# Patient Record
Sex: Male | Born: 1985 | Race: Black or African American | Hispanic: No | Marital: Single | Smoking: Current every day smoker
Health system: Southern US, Community
[De-identification: ages and names within clinical notes are randomized; demographics above are authoritative.]

## PROBLEM LIST (undated history)

## (undated) HISTORY — PX: NO PAST SURGERIES: SHX2092

---

## 2017-10-18 ENCOUNTER — Ambulatory Visit
Admission: EM | Admit: 2017-10-18 | Discharge: 2017-10-18 | Disposition: A | Discharge status: Home / Self Care | Payer: BLUE CROSS/BLUE SHIELD | Attending: Family Medicine | Admitting: Family Medicine

## 2017-10-18 ENCOUNTER — Encounter: Payer: Self-pay | Admitting: *Deleted

## 2017-10-18 DIAGNOSIS — R110 Nausea: Secondary | ICD-10-CM

## 2017-10-18 DIAGNOSIS — Z113 Encounter for screening for infections with a predominantly sexual mode of transmission: Secondary | ICD-10-CM | POA: Diagnosis not present

## 2017-10-18 DIAGNOSIS — R197 Diarrhea, unspecified: Secondary | ICD-10-CM | POA: Diagnosis not present

## 2017-10-18 DIAGNOSIS — Z202 Contact with and (suspected) exposure to infections with a predominantly sexual mode of transmission: Secondary | ICD-10-CM | POA: Diagnosis not present

## 2017-10-18 DIAGNOSIS — F17200 Nicotine dependence, unspecified, uncomplicated: Secondary | ICD-10-CM

## 2017-10-18 DIAGNOSIS — R1310 Dysphagia, unspecified: Secondary | ICD-10-CM | POA: Diagnosis not present

## 2017-10-18 LAB — CHLAMYDIA/NGC RT PCR (ARMC ONLY)
Chlamydia Tr: NOT DETECTED
N gonorrhoeae: NOT DETECTED

## 2017-10-18 NOTE — Discharge Instructions (Signed)
Recommend use of Metamucil type medication on a daily basis. Laboratory tests will be available tomorrow and over the next 3-4 days. Follow-up with ear nose and throat physician regarding your swallowing difficulty.

## 2017-10-18 NOTE — ED Provider Notes (Signed)
[facility]    CSN: [id number] Arrival date & time: 10/18/17  1038     History   Chief Complaint Chief Complaint  Patient presents with  . Sore Throat  . Nausea  . Diarrhea    HPI Juan Humphrey is a 31 y.o. male.   HPI  This a 31 year old male who presents with a several different problems.  His problem is that of nausea and diarrhea that he's had for 1 month. He states that he's had intermittent green stools. Is also concerned that he saw stool does not appear "normal". The stool is mostly loose but not quite diarrhea. He does admit that only eats  meat and does not eat any fruits or vegetables. He denies any abdominal pain except for loading prior to a BM and then feels improvement afterwards. The pain is diffuse. He has had no vomiting. He denies any weight loss. Denies any melena; has had no tarry stools.  Second problem is that of possible STD exposure. He did use protection but is still concerned that he had an encounter with a male that he did not know. This was approximately 2 weeks ago. He denies any scrotal pain. He's had no penile discharge. He has had no sores on his penis or in the groin. He denies any fever or chills.  He is also concerned with a feeling in his throat on the right when he swallows. Is not painful. He is just alarmed regarding presence due to his smoking history. He states his mother and aunt also had polyps.        History reviewed. No pertinent past medical history.  There are no active problems to display for this patient.   History reviewed. No pertinent surgical history.     Home Medications    Prior to Admission medications   Not on File    Family History Family History  Problem Relation Age of Onset  . Hypertension Mother   . Hypertension Father     Social History Social History  Substance Use Topics  . Smoking status: Current Every Day Smoker    Packs/day: 0.50    Types: Cigarettes  . Smokeless  tobacco: Never Used  . Alcohol use Yes     Allergies   Patient has no known allergies.   Review of Systems Review of Systems  Constitutional: Positive for activity change and appetite change. Negative for chills, fatigue and fever.  HENT: Positive for trouble swallowing.   Gastrointestinal: Positive for constipation and nausea. Negative for anal bleeding, blood in stool, rectal pain and vomiting.  Genitourinary: Negative for difficulty urinating, discharge, dysuria, genital sores, penile pain, scrotal swelling, testicular pain and urgency.  All other systems reviewed and are negative.    Physical Exam Triage Vital Signs ED Triage Vitals  Enc Vitals Group     BP 10/18/17 1114 (!) 141/83     Pulse Rate 10/18/17 1114 73     Resp 10/18/17 1114 16     Temp 10/18/17 1114 98.4 F (36.9 C)     Temp Source 10/18/17 1114 Oral     SpO2 10/18/17 1114 100 %     Weight 10/18/17 1115 198 lb (89.8 kg)     Height 10/18/17 1115 5\' 11"  (1.803 m)     Head Circumference --      Peak Flow --      Pain Score 10/18/17 1116 0     Pain Loc --      Pain Edu? --  Excl. in GC? --    No data found.   Updated Vital Signs BP (!) 141/83 (BP Location: Left Arm)   Pulse 73   Temp 98.4 F (36.9 C) (Oral)   Resp 16   Ht 5\' 11"  (1.803 m)   Wt 198 lb (89.8 kg)   SpO2 100%   BMI 27.62 kg/m   Visual Acuity Right Eye Distance:   Left Eye Distance:   Bilateral Distance:    Right Eye Near:   Left Eye Near:    Bilateral Near:     Physical Exam  Constitutional: He is oriented to person, place, and time. He appears well-developed and well-nourished. No distress.  HENT:  Head: Normocephalic.  Right Ear: External ear normal.  Left Ear: External ear normal.  Nose: Nose normal.  Mouth/Throat: Oropharynx is clear and moist. No oropharyngeal exudate.  Examination of the pharynx and throat shows no masses palpable. He has no lesions in the posterior pharynx. It appears normal. With swallowing  today he has no perception of the right sided abnormality that he describes. Is no obvious abnormality of the swallowing mechanism.  Eyes: Pupils are equal, round, and reactive to light. Right eye exhibits no discharge. Left eye exhibits no discharge.  Neck: Normal range of motion.  Pulmonary/Chest: Effort normal and breath sounds normal.  Abdominal: Soft. Bowel sounds are normal. He exhibits no distension and no mass. There is no tenderness. There is no rebound and no guarding.  Musculoskeletal: Normal range of motion.  Lymphadenopathy:    He has no cervical adenopathy.  Neurological: He is alert and oriented to person, place, and time.  Skin: Skin is warm and dry. He is not diaphoretic.  Psychiatric: He has a normal mood and affect. His behavior is normal. Judgment and thought content normal.  Nursing note and vitals reviewed.    UC Treatments / Results  Labs (all labs ordered are listed, but only abnormal results are displayed) Labs Reviewed  CHLAMYDIA/NGC RT PCR ([facility] ONLY)  HIV ANTIBODY (ROUTINE TESTING)  RPR    EKG  EKG Interpretation None       Radiology No results found.  Procedures Procedures (including critical care time)  Medications Ordered in UC Medications - No data to display   Initial Impression / Assessment and Plan / UC Course  I have reviewed the triage vital signs and the nursing notes.  Pertinent labs & imaging results that were available during my care of the patient were reviewed by me and considered in my medical decision making (see chart for details).     Plan: 1. Test/x-ray results and diagnosis reviewed with patient 2. rx as per orders; risks, benefits, potential side effects reviewed with patient 3. Recommend supportive treatment with use of fiber bulking products. He will obtain Metamucil or similar product and start using that twice a day. If that does not help he should follow-up with a gastroenterologist or may go through  with the primary care physician. C testing was obtained at the patient's request. Results will be available tomorrow and over the next 3-4 days. I will recommend he follow-up with an ENT regarding his throat questions. I have also recommended that he  obtain a primary care physician. 4. F/u prn if symptoms worsen or don't improve   Final Clinical Impressions(s) / UC Diagnoses   Final diagnoses:  Potential exposure to STD  Tobacco use disorder    New Prescriptions There are no discharge medications for this patient.    Controlled  Substance Prescriptions [place] Controlled Substance Registry consulted? Not Applicable   Lutricia Feil, PA-C 10/18/17 1435

## 2017-10-18 NOTE — ED Triage Notes (Signed)
Patient started having symptoms of nausea and diarrhea 1 month ago. Patient reports green stools. Symptom of sore throat started 2 weeks ago.

## 2017-10-19 LAB — HIV ANTIBODY (ROUTINE TESTING W REFLEX): HIV Screen 4th Generation wRfx: NONREACTIVE

## 2017-10-19 LAB — RPR, QUANT+TP ABS (REFLEX)
Rapid Plasma Reagin, Quant: 1:1 {titer} — ABNORMAL HIGH
T Pallidum Abs: NEGATIVE

## 2017-10-19 LAB — RPR: RPR Ser Ql: REACTIVE — AB

## 2017-10-22 ENCOUNTER — Encounter: Payer: Self-pay | Admitting: *Deleted

## 2017-10-22 ENCOUNTER — Ambulatory Visit
Admission: EM | Admit: 2017-10-22 | Discharge: 2017-10-22 | Disposition: A | Discharge status: Home / Self Care | Payer: BLUE CROSS/BLUE SHIELD | Attending: Family Medicine | Admitting: Family Medicine

## 2017-10-22 DIAGNOSIS — A539 Syphilis, unspecified: Secondary | ICD-10-CM

## 2017-10-22 MED ORDER — PENICILLIN G BENZATHINE 1200000 UNIT/2ML IM SUSP
2.4000 10*6.[IU] | Freq: Once | INTRAMUSCULAR | Status: AC
  Administered 2017-10-22: 2.4 10*6.[IU] via INTRAMUSCULAR

## 2017-10-22 NOTE — ED Triage Notes (Signed)
Patient has returned for treatment of syphilis. 2.4 of bicillin given as per Dr Dayton ScrapeMurray.

## 2017-10-26 ENCOUNTER — Ambulatory Visit: Payer: Self-pay | Admitting: Nurse Practitioner

## 2019-02-24 ENCOUNTER — Ambulatory Visit (INDEPENDENT_AMBULATORY_CARE_PROVIDER_SITE_OTHER): Payer: Managed Care, Other (non HMO)

## 2019-02-24 ENCOUNTER — Ambulatory Visit
Admission: EM | Admit: 2019-02-24 | Discharge: 2019-02-24 | Disposition: A | Discharge status: Home / Self Care | Payer: Managed Care, Other (non HMO) | Attending: Family Medicine | Admitting: Family Medicine

## 2019-02-24 ENCOUNTER — Encounter: Payer: Self-pay | Admitting: Emergency Medicine

## 2019-02-24 ENCOUNTER — Other Ambulatory Visit: Payer: Self-pay

## 2019-02-24 DIAGNOSIS — Y9367 Activity, basketball: Secondary | ICD-10-CM

## 2019-02-24 DIAGNOSIS — S86001A Unspecified injury of right Achilles tendon, initial encounter: Secondary | ICD-10-CM | POA: Diagnosis not present

## 2019-02-24 MED ORDER — TRAMADOL HCL 50 MG PO TABS: 50.0000 mg | ORAL_TABLET | Freq: Three times a day (TID) | ORAL | 0 refills | Status: DC | PRN

## 2019-02-24 MED ORDER — BETAMETHASONE DIPROPIONATE 0.05 % EX OINT: TOPICAL_OINTMENT | Freq: Two times a day (BID) | CUTANEOUS | 0 refills | Status: DC

## 2019-02-24 NOTE — ED Triage Notes (Addendum)
Patient in today c/o right upper ankle/low calf pain after injuring it yesterday playing basketball.

## 2019-02-24 NOTE — ED Provider Notes (Signed)
[facility]    CSN: [id number] Arrival date & time: 02/24/19  1414  History   Chief Complaint Chief Complaint  Patient presents with  . Ankle Injury    right DOI 02/23/19    HPI  33 year old male presents with an injury.  Patient reports he was playing basketball yesterday.  He began to sprint and then suddenly felt a pop in the back of his ankle/calf.  Patient states that he has been unable to ambulate.  Pain is quite severe.  Localizes the pain to the Achilles.  Associated swelling.  Patient unable to dorsiflex and plantarflex his foot.  Worse with activity.  No relieving factors.  No other complaints.  PMH, Surgical Hx, Family Hx, Social History reviewed and updated as below.  No significant PMH.  Past Surgical History:  Procedure Laterality Date  . NO PAST SURGERIES     Home Medications    Prior to Admission medications   Medication Sig Start Date End Date Taking? Authorizing Provider  traMADol (ULTRAM) 50 MG tablet Take 1 tablet (50 mg total) by mouth every 8 (eight) hours as needed. 02/24/19   Tommie Sams, DO    Family History Family History  Problem Relation Age of Onset  . Hypertension Mother   . Hypertension Father     Social History Social History   Tobacco Use  . Smoking status: Current Every Day Smoker    Packs/day: 0.50    Years: 12.00    Pack years: 6.00    Types: Cigarettes  . Smokeless tobacco: Never Used  Substance Use Topics  . Alcohol use: Yes    Comment: rarely  . Drug use: No     Allergies   Patient has no known allergies.   Review of Systems Review of Systems  Constitutional: Negative.   Musculoskeletal:       Posterior ankle pain/achilles injury.   Physical Exam Triage Vital Signs ED Triage Vitals  Enc Vitals Group     BP 02/24/19 1504 128/90     Pulse Rate 02/24/19 1504 68     Resp 02/24/19 1504 16     Temp 02/24/19 1504 98 F (36.7 C)     Temp Source 02/24/19 1504 Oral     SpO2 02/24/19 1504 100 %   Weight 02/24/19 1505 190 lb (86.2 kg)     Height 02/24/19 1505 6' (1.829 m)     Head Circumference --      Peak Flow --      Pain Score 02/24/19 1504 8     Pain Loc --      Pain Edu? --      Excl. in GC? --     Updated Vital Signs BP 128/90 (BP Location: Left Arm)   Pulse 68   Temp 98 F (36.7 C) (Oral)   Resp 16   Ht 6' (1.829 m)   Wt 86.2 kg   SpO2 100%   BMI 25.77 kg/m   Visual Acuity Right Eye Distance:   Left Eye Distance:   Bilateral Distance:    Right Eye Near:   Left Eye Near:    Bilateral Near:     Physical Exam Constitutional:      General: He is not in acute distress.    Appearance: Normal appearance.  HENT:     Head: Normocephalic and atraumatic.  Eyes:     General: No scleral icterus.    Conjunctiva/sclera: Conjunctivae normal.  Pulmonary:     Effort: Pulmonary  effort is normal. No respiratory distress.  Musculoskeletal:     Comments: Right foot and ankle -with swelling and exquisite tenderness of the distal calf/Achilles.  Swelling noted.  Squeeze test revealed no plantar flexion.  Skin:    Comments: Right foot -raised, dry vesicular rash.  Neurological:     Mental Status: He is alert.  Psychiatric:        Mood and Affect: Mood normal.        Behavior: Behavior normal.    UC Treatments / Results  Labs (all labs ordered are listed, but only abnormal results are displayed) Labs Reviewed - No data to display  EKG None  Radiology Dg Ankle Complete Right  Result Date: 02/24/2019 CLINICAL DATA:  Distal calf pain after playing basketball yesterday. EXAM: RIGHT ANKLE - COMPLETE 3+ VIEW COMPARISON:  None. FINDINGS: No acute fracture or dislocation. Well corticated ossific density at the tip of the medial malleolus is consistent with prior trauma. The ankle mortise is symmetric. The talar dome is intact. No tibiotalar joint effusion. Joint spaces are preserved. Bone mineralization is normal. Thickening of the distal Achilles shadow with edema in  Kager's fat pad. IMPRESSION: 1. Thickening of the distal Achilles shadow with edema in Kager's fat pad, concerning for Achilles tendon injury. Consider MRI for further evaluation. 2.  No acute osseous abnormality. Electronically Signed   By: Obie Dredge M.D.   On: 02/24/2019 16:04    Procedures Procedures (including critical care time)  Medications Ordered in UC Medications - No data to display  Initial Impression / Assessment and Plan / UC Course  I have reviewed the triage vital signs and the nursing notes.  Pertinent labs & imaging results that were available during my care of the patient were reviewed by me and considered in my medical decision making (see chart for details).    33 year old male presents with Achilles tendon injury.  Placed in a posterior splint.  Crutches.  Nonweightbearing.  Tramadol for pain.  Patient is to call/see orthopedics soon as possible.  Final Clinical Impressions(s) / UC Diagnoses   Final diagnoses:  Injury of right Achilles tendon, initial encounter     Discharge Instructions     Non weight bearing.  Call (or you can go during walk in hours) [facility] in [place] ([address]).  Medication as needed.  Take care  Dr. Adriana Simas   ED Prescriptions    Medication Sig Dispense Auth. Provider   traMADol (ULTRAM) 50 MG tablet Take 1 tablet (50 mg total) by mouth every 8 (eight) hours as needed. 10 tablet Tommie Sams, DO     Controlled Substance Prescriptions [place] Controlled Substance Registry consulted? Not Applicable   Tommie Sams, DO 02/24/19 1643

## 2019-02-24 NOTE — ED Triage Notes (Signed)
Patient states he is unable to bear weight on his right foot/leg.

## 2019-02-24 NOTE — Discharge Instructions (Addendum)
Non weight bearing.  Call (or you can go during walk in hours) [facility] in [place] ([address]  Medication as needed.  Take care  Dr. Adriana Simas

## 2019-06-25 ENCOUNTER — Encounter: Payer: Self-pay | Admitting: Emergency Medicine

## 2019-06-25 ENCOUNTER — Other Ambulatory Visit: Payer: Self-pay

## 2019-06-25 ENCOUNTER — Ambulatory Visit
Admission: EM | Admit: 2019-06-25 | Discharge: 2019-06-25 | Disposition: A | Discharge status: Home / Self Care | Payer: Managed Care, Other (non HMO) | Attending: Emergency Medicine | Admitting: Emergency Medicine

## 2019-06-25 DIAGNOSIS — W57XXXA Bitten or stung by nonvenomous insect and other nonvenomous arthropods, initial encounter: Secondary | ICD-10-CM

## 2019-06-25 DIAGNOSIS — L03115 Cellulitis of right lower limb: Secondary | ICD-10-CM

## 2019-06-25 DIAGNOSIS — S80861A Insect bite (nonvenomous), right lower leg, initial encounter: Secondary | ICD-10-CM

## 2019-06-25 MED ORDER — PREDNISONE 10 MG PO TABS: ORAL_TABLET | ORAL | 0 refills | Status: DC

## 2019-06-25 MED ORDER — DOXYCYCLINE HYCLATE 100 MG PO CAPS: 100.0000 mg | ORAL_CAPSULE | Freq: Two times a day (BID) | ORAL | 0 refills | Status: AC

## 2019-06-25 NOTE — ED Triage Notes (Signed)
Patient c/o insect bite and swelling in his right lower leg.  Patient states that he has sharp pain going down his right leg into his right ankle.

## 2019-06-25 NOTE — Discharge Instructions (Signed)
Take medication as prescribed. Rest. Drink plenty of fluids. Use brace. Elevate. Ice.   Follow-up with your orthopedist this week as discussed.  Follow up with your primary care physician this week as needed. Return to Urgent care for new or worsening concerns.

## 2019-06-25 NOTE — ED Provider Notes (Signed)
[facility] ____________________________________________  Time seen: Approximately 8:48 AM  I have reviewed the triage vital signs and the nursing notes.   HISTORY  Chief Complaint Insect Bite   HPI Juan Humphrey is a 33 y.o. male presenting for evaluation of right posterior calf insect bite.  Patient reports that he woke up Monday morning with a bite mark to the back of his right calf with associated redness and itching.  States the redness and itching became fairly large and with some tenderness.  States the redness is somewhat better today.  Unsure of type of insect bite.  Does have a dog in the house.  No known tick bites.  No alleviating measures attempted.  States it is still somewhat itchy and redness present.  Denies pain to the front of his leg.  Patient reports he has a right Achilles tear that he has been dealing with for approximately 4 months and has been following with [facility].  States that he has been cleared from a cast and boot and nail using splint.  States he has noticed a little bit more swelling around his Achilles since the bite.  Denies chest pain, shortness of breath, fevers, difficulty breathing, other skin changes.  No recent sickness.  Reports tetanus immunization is up-to-date.  Denies any fall or injury.  Denies paresthesias.   History reviewed. No pertinent past medical history.  There are no active problems to display for this patient.   Past Surgical History:  Procedure Laterality Date  . NO PAST SURGERIES       No current facility-administered medications for this encounter.   Current Outpatient Medications:  .  doxycycline (VIBRAMYCIN) 100 MG capsule, Take 1 capsule (100 mg total) by mouth 2 (two) times daily for 7 days., Disp: 14 capsule, Rfl: 0 .  predniSONE (DELTASONE) 10 MG tablet, Start 60 mg po day one, then 50 mg po day two, taper by 10 mg daily until complete., Disp: 21 tablet, Rfl: 0  Allergies Patient has no known  allergies.  Family History  Problem Relation Age of Onset  . Hypertension Mother   . Hypertension Father     Social History Social History   Tobacco Use  . Smoking status: Current Every Day Smoker    Packs/day: 0.50    Years: 12.00    Pack years: 6.00    Types: Cigarettes  . Smokeless tobacco: Never Used  Substance Use Topics  . Alcohol use: Yes    Comment: rarely  . Drug use: No    Review of Systems Constitutional: No fever Cardiovascular: Denies chest pain. Respiratory: Denies shortness of breath. Gastrointestinal: No abdominal pain.  No nausea, no vomiting.  No diarrhea.  Genitourinary: Negative for dysuria. Musculoskeletal: Positive right leg pain. Skin: Positive skin changes. Neurological: Negative for headaches, focal weakness or numbness.   ____________________________________________   PHYSICAL EXAM:  VITAL SIGNS: ED Triage Vitals  Enc Vitals Group     BP 06/25/19 0825 (!) 134/93     Pulse Rate 06/25/19 0825 85     Resp 06/25/19 0825 16     Temp 06/25/19 0825 98.7 F (37.1 C)     Temp Source 06/25/19 0825 Oral     SpO2 06/25/19 0825 100 %     Weight 06/25/19 0823 180 lb (81.6 kg)     Height 06/25/19 0823 6' (1.829 m)     Head Circumference --      Peak Flow --      Pain Score 06/25/19  [id number]     Pain Loc --      Pain Edu? --      Excl. in GC? --     Constitutional: Alert and oriented. Well appearing and in no acute distress. ENT      Head: Normocephalic and atraumatic. Cardiovascular: Normal rate, regular rhythm. Grossly normal heart sounds.  Good peripheral circulation. Respiratory: Normal respiratory effort without tachypnea nor retractions. Breath sounds are clear and equal bilaterally. No wheezes, rales, rhonchi. Musculoskeletal: Steady gait Neurologic:  Normal speech and language.Speech is normal.  Skin:  Skin is warm, dry.  Except: Right posterior medial calf centered excoriated area with 1.5 x 1.5 cm area of induration, no fluctuance,  with mild surrounding erythema and tenderness, no circumferential edema, no pitting edema, right posterior Achilles mild localized tenderness and edema, able to plantar flex and dorsiflex, negative Thompson squeeze test bilaterally, bilateral distal dorsalis pedis and posterior tibialis pulses equal and easily palpated, right lower extremity otherwise nontender. Psychiatric: Mood and affect are normal. Speech and behavior are normal. Patient exhibits appropriate insight and judgment   ___________________________________________   LABS (all labs ordered are listed, but only abnormal results are displayed)  Labs Reviewed - No data to display ____________________________________________  PROCEDURES Procedures   INITIAL IMPRESSION / ASSESSMENT AND PLAN / ED COURSE  Pertinent labs & imaging results that were available during my care of the patient were reviewed by me and considered in my medical decision making (see chart for details).  Well-appearing patient.  No acute distress.  Appearance of insect bite to right posterior calf with local reaction and secondary cellulitis.  Will treat with prednisone and doxycycline, over-the-counter topical antibiotic ointment.  Discussed elevation, ice and supportive care.  Also encouraged patient to continue his brace for his Achilles tear and follow-up with his orthopedics this week.  Reports that he missed his last appointment 2 weeks ago and will reschedule for this week.Discussed indication, risks and benefits of medications with patient.  Work note given.  Discussed follow up with Primary care physician this week. Discussed follow up and return parameters including no resolution or any worsening concerns. Patient verbalized understanding and agreed to plan.   ____________________________________________   FINAL CLINICAL IMPRESSION(S) / ED DIAGNOSES  Final diagnoses:  Insect bite of right lower leg, initial encounter  Cellulitis of right lower  extremity     ED Discharge Orders         Ordered    predniSONE (DELTASONE) 10 MG tablet     06/25/19 0845    doxycycline (VIBRAMYCIN) 100 MG capsule  2 times daily     06/25/19 0845           Note: This dictation was prepared with Dragon dictation along with smaller phrase technology. Any transcriptional errors that result from this process are unintentional.         Renford DillsMiller, Tamaiya Bump, NP 06/25/19 [phone]

## 2020-07-08 IMAGING — CR DG ANKLE COMPLETE 3+V*R*
3 series · 3 of 3 positions shown · non-contrast
Comparison: None.

CLINICAL DATA: Distal calf pain after playing basketball yesterday.

EXAM:
RIGHT ANKLE - COMPLETE 3+ VIEW

[ankle ap]
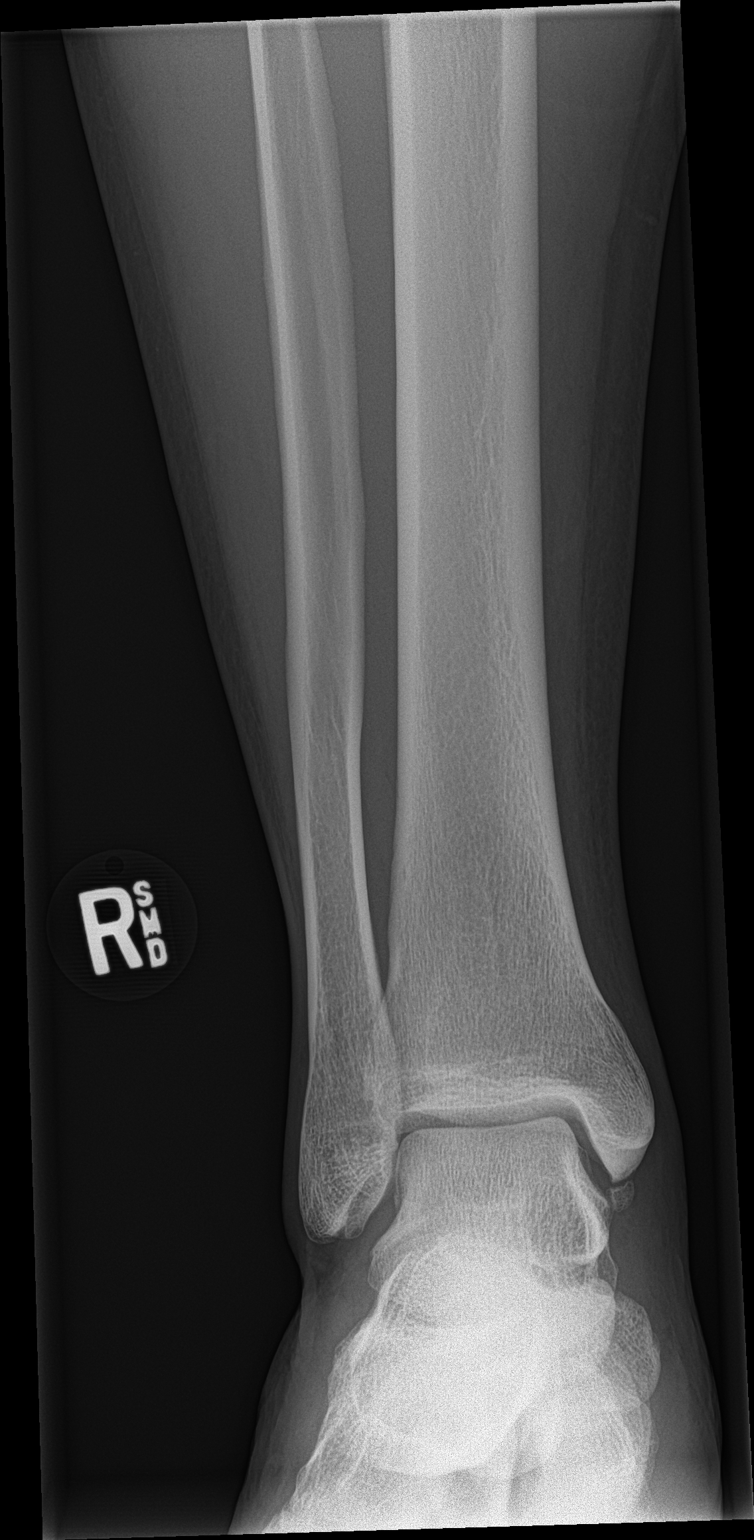

[ankle obl]
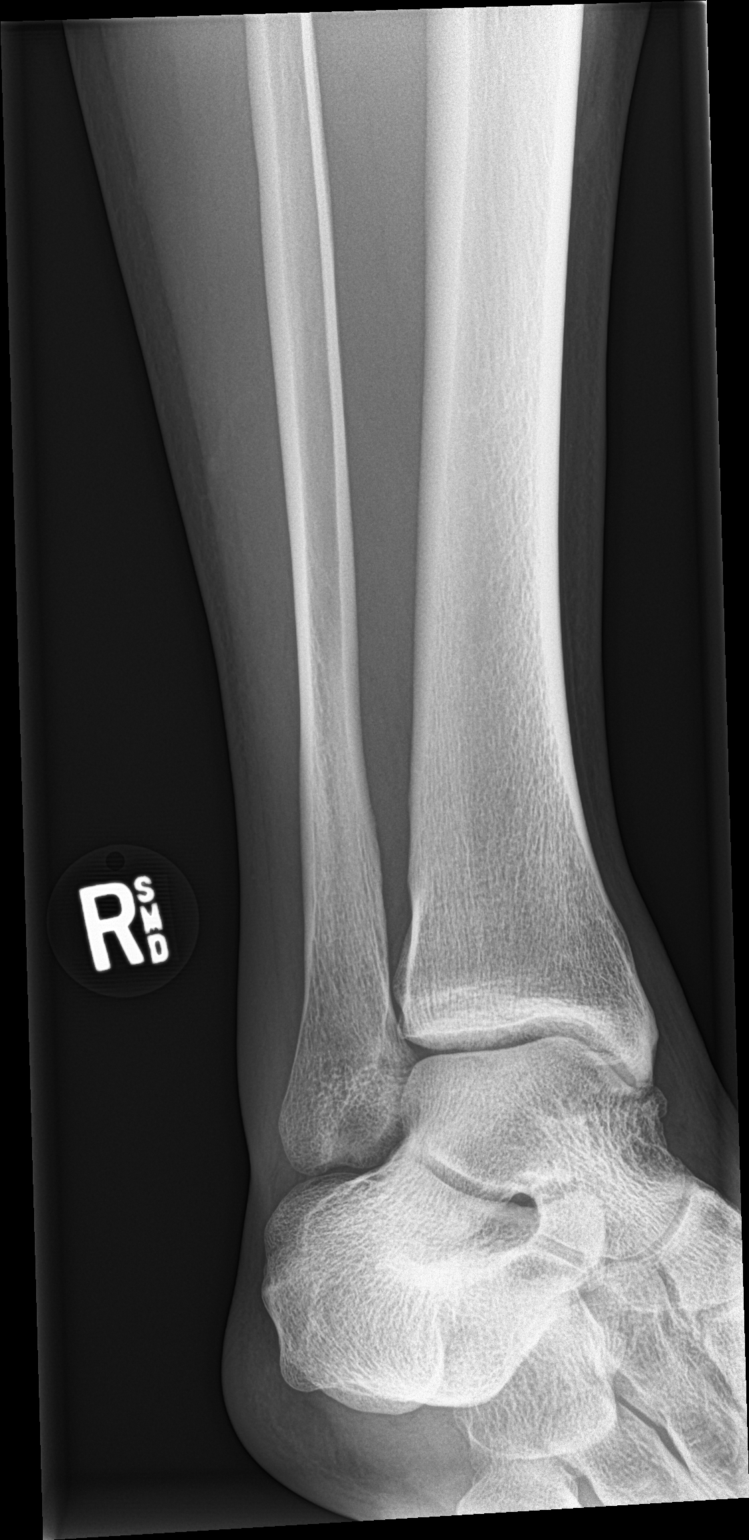

[ankle lat]
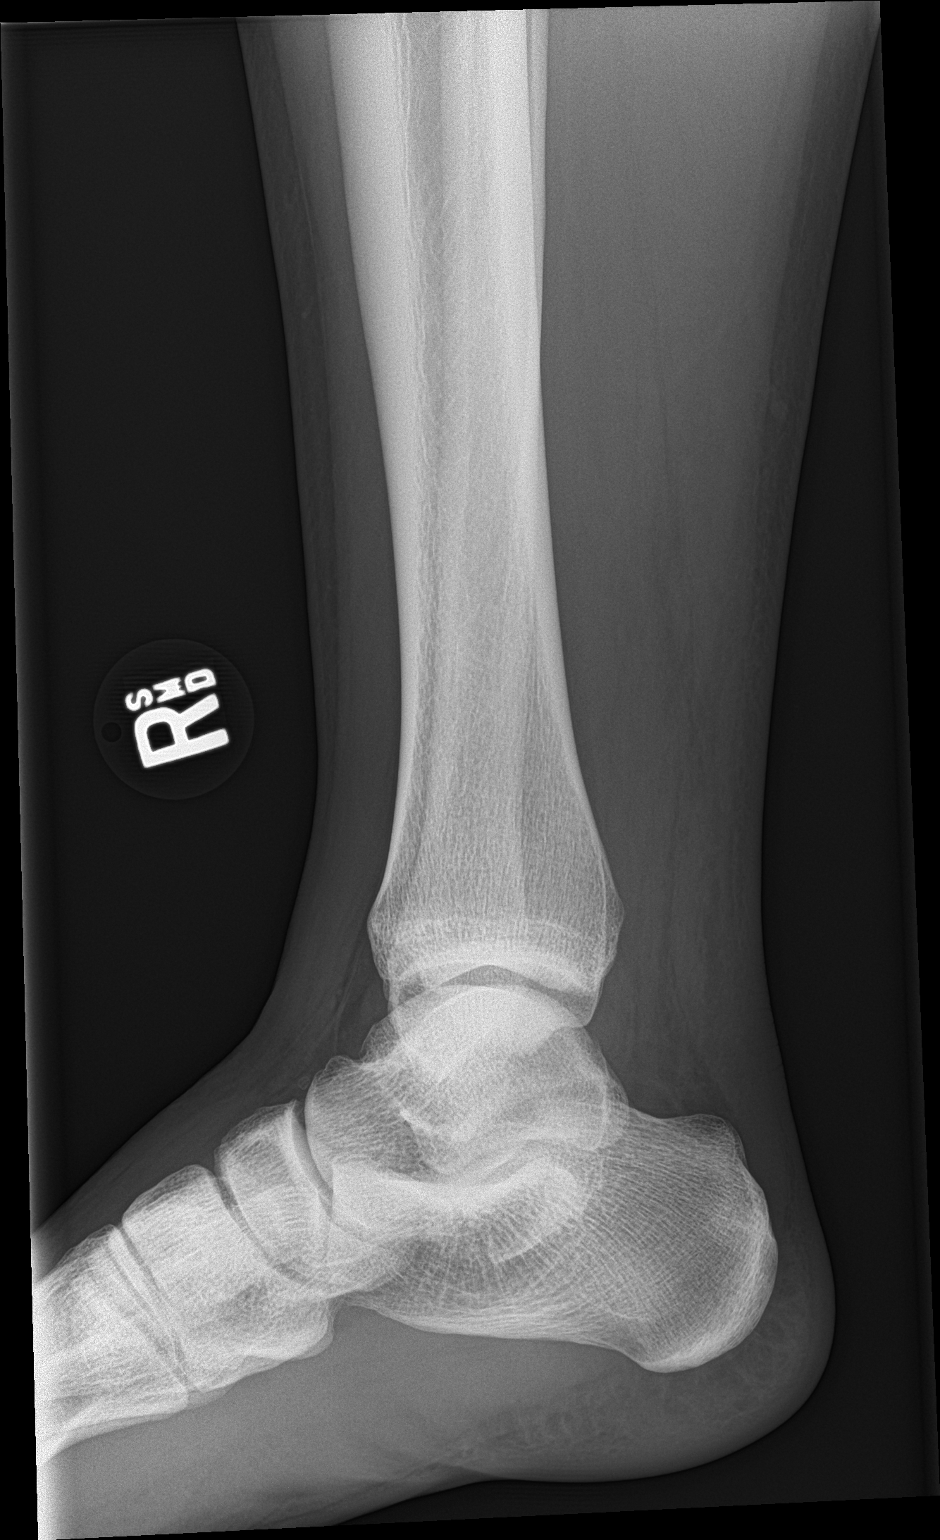

[3 of 3 positions shown; findings below may reference images not displayed]

FINDINGS: No acute fracture or dislocation. Well corticated ossific density at
the tip of the medial malleolus is consistent with prior trauma. The
ankle mortise is symmetric. The talar dome is intact. No tibiotalar
joint effusion. Joint spaces are preserved. Bone mineralization is
normal. Thickening of the distal Achilles shadow with edema in
Kager's fat pad.
IMPRESSION: 1. Thickening of the distal Achilles shadow with edema in Kager's
fat pad, concerning for Achilles tendon injury. Consider MRI for
further evaluation.
2.  No acute osseous abnormality.

## 2021-01-13 ENCOUNTER — Ambulatory Visit
Admission: EM | Admit: 2021-01-13 | Discharge: 2021-01-13 | Disposition: A | Discharge status: Home / Self Care | Payer: BC Managed Care – PPO | Attending: Family Medicine | Admitting: Family Medicine

## 2021-01-13 ENCOUNTER — Other Ambulatory Visit: Payer: Self-pay

## 2021-01-13 DIAGNOSIS — M5432 Sciatica, left side: Secondary | ICD-10-CM | POA: Diagnosis not present

## 2021-01-13 MED ORDER — CYCLOBENZAPRINE HCL 5 MG PO TABS: 5.0000 mg | ORAL_TABLET | Freq: Three times a day (TID) | ORAL | 0 refills | Status: AC | PRN

## 2021-01-13 MED ORDER — PREDNISONE 10 MG (21) PO TBPK: ORAL_TABLET | ORAL | 0 refills | Status: AC

## 2021-01-13 NOTE — Discharge Instructions (Addendum)
Treating you for sciatic nerve pain.  Take the prednisone daily for the next 6 days.  Tightness with food.  You can use the Flexeril for muscle laxation as needed.  This may make you drowsy be aware of this. Information in your packet given on stretches to do Follow up as needed for continued or worsening symptoms

## 2021-01-13 NOTE — ED Triage Notes (Signed)
Pt c/o lower back pain radiating to left leg anterior and posterior aspect for approx 2 weeks with intermittent numbness.

## 2021-01-13 NOTE — ED Provider Notes (Signed)
Juan Humphrey    CSN: [id number] Arrival date & time: 01/13/21  4287      History   Chief Complaint Chief Complaint  Patient presents with  . Back Pain    HPI Juan Humphrey is a 35 y.o. male.   Patient is a 35 year old male who presents today with lower back pain. This has been present for approximate 2 weeks. The pain radiates down the left buttocks and left leg. Some intermittent numbness. Feels like his leg gives out at times . No fever, chills, loss of bowel or bladder function. No injuries. Does sometimes lifting at work. Has been soaking in Epson salt  with some temporary relief.      History reviewed. No pertinent past medical history.  There are no problems to display for this patient.   Past Surgical History:  Procedure Laterality Date  . NO PAST SURGERIES         Home Medications    Prior to Admission medications   Medication Sig Start Date End Date Taking? Authorizing Provider  cyclobenzaprine (FLEXERIL) 5 MG tablet Take 1 tablet (5 mg total) by mouth 3 (three) times daily as needed for muscle spasms. 01/13/21  Yes Liora Myles A, NP  predniSONE (STERAPRED UNI-PAK 21 TAB) 10 MG (21) TBPK tablet 6 tabs for 1 day, then 5 tabs for 1 das, then 4 tabs for 1 day, then 3 tabs for 1 day, 2 tabs for 1 day, then 1 tab for 1 day 01/13/21  Yes Janace Aris, NP    Family History Family History  Problem Relation Age of Onset  . Hypertension Mother   . Hypertension Father     Social History Social History   Tobacco Use  . Smoking status: Current Every Day Smoker    Packs/day: 0.50    Years: 12.00    Pack years: 6.00    Types: Cigarettes  . Smokeless tobacco: Never Used  Vaping Use  . Vaping Use: Never used  Substance Use Topics  . Alcohol use: Yes    Comment: rarely  . Drug use: No     Allergies   Patient has no known allergies.   Review of Systems Review of Systems   Physical Exam Triage Vital Signs ED Triage Vitals  Enc Vitals  Group     BP 01/13/21 0903 (!) 150/93     Pulse Rate 01/13/21 0903 71     Resp 01/13/21 0903 18     Temp 01/13/21 0903 98.2 F (36.8 C)     Temp Source 01/13/21 0903 Oral     SpO2 01/13/21 0903 96 %     Weight --      Height --      Head Circumference --      Peak Flow --      Pain Score 01/13/21 0913 7     Pain Loc --      Pain Edu? --      Excl. in GC? --    No data found.  Updated Vital Signs BP (!) 150/93 (BP Location: Left Arm)   Pulse 71   Temp 98.2 F (36.8 C) (Oral)   Resp 18   SpO2 96%   Visual Acuity Right Eye Distance:   Left Eye Distance:   Bilateral Distance:    Right Eye Near:   Left Eye Near:    Bilateral Near:     Physical Exam Vitals and nursing note reviewed.  Constitutional:  Appearance: Normal appearance.  HENT:     Head: Normocephalic and atraumatic.  Eyes:     Conjunctiva/sclera: Conjunctivae normal.  Pulmonary:     Effort: Pulmonary effort is normal.  Musculoskeletal:     Cervical back: Normal range of motion.     Lumbar back: Decreased range of motion. Positive left straight leg raise test. Negative right straight leg raise test.       Back:  Skin:    General: Skin is warm and dry.  Neurological:     Mental Status: He is alert.  Psychiatric:        Mood and Affect: Mood normal.      UC Treatments / Results  Labs (all labs ordered are listed, but only abnormal results are displayed) Labs Reviewed - No data to display  EKG   Radiology No results found.  Procedures Procedures (including critical care time)  Medications Ordered in UC Medications - No data to display  Initial Impression / Assessment and Plan / UC Course  I have reviewed the triage vital signs and the nursing notes.  Pertinent labs & imaging results that were available during my care of the patient were reviewed by me and considered in my medical decision making (see chart for details).     Left-sided sciatic nerve pain No concerning red  flags. We will treat with prednisone taper over the next 6 days. Flexeril for muscle laxation as needed. Stretches including packet to perform.  Follow up as needed for continued or worsening symptoms  Final Clinical Impressions(s) / UC Diagnoses   Final diagnoses:  Sciatic nerve pain, left     Discharge Instructions     Treating you for sciatic nerve pain.  Take the prednisone daily for the next 6 days.  Tightness with food.  You can use the Flexeril for muscle laxation as needed.  This may make you drowsy be aware of this. Information in your packet given on stretches to do Follow up as needed for continued or worsening symptoms     ED Prescriptions    Medication Sig Dispense Auth. Provider   predniSONE (STERAPRED UNI-PAK 21 TAB) 10 MG (21) TBPK tablet 6 tabs for 1 day, then 5 tabs for 1 das, then 4 tabs for 1 day, then 3 tabs for 1 day, 2 tabs for 1 day, then 1 tab for 1 day 21 tablet Chanele Douglas A, NP   cyclobenzaprine (FLEXERIL) 5 MG tablet Take 1 tablet (5 mg total) by mouth 3 (three) times daily as needed for muscle spasms. 30 tablet Dahlia Byes A, NP     PDMP not reviewed this encounter.   Janace Aris, NP 01/13/21 [phone]

## 2021-01-14 ENCOUNTER — Ambulatory Visit: Payer: Self-pay
